# Patient Record
Sex: Male | Born: 2000 | Hispanic: Yes | Marital: Single | Smoking: Never smoker
Health system: Southern US, Community
[De-identification: ages and names within clinical notes are randomized; demographics above are authoritative.]

## PROBLEM LIST (undated history)

## (undated) DIAGNOSIS — R45851 Suicidal ideations: Secondary | ICD-10-CM

## (undated) DIAGNOSIS — F938 Other childhood emotional disorders: Secondary | ICD-10-CM

## (undated) DIAGNOSIS — F840 Autistic disorder: Secondary | ICD-10-CM

## (undated) DIAGNOSIS — F323 Major depressive disorder, single episode, severe with psychotic features: Secondary | ICD-10-CM

## (undated) HISTORY — DX: Suicidal ideations: R45.851

---
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
## 2020-01-01 DIAGNOSIS — F320 Major depressive disorder, single episode, mild: Secondary | ICD-10-CM | POA: Diagnosis not present

## 2020-01-08 DIAGNOSIS — F320 Major depressive disorder, single episode, mild: Secondary | ICD-10-CM | POA: Diagnosis not present

## 2020-01-15 DIAGNOSIS — F320 Major depressive disorder, single episode, mild: Secondary | ICD-10-CM | POA: Diagnosis not present

## 2020-01-22 DIAGNOSIS — F320 Major depressive disorder, single episode, mild: Secondary | ICD-10-CM | POA: Diagnosis not present

## 2020-01-29 DIAGNOSIS — F320 Major depressive disorder, single episode, mild: Secondary | ICD-10-CM | POA: Diagnosis not present

## 2020-02-05 DIAGNOSIS — F320 Major depressive disorder, single episode, mild: Secondary | ICD-10-CM | POA: Diagnosis not present

## 2020-02-12 DIAGNOSIS — F320 Major depressive disorder, single episode, mild: Secondary | ICD-10-CM | POA: Diagnosis not present

## 2020-02-19 DIAGNOSIS — F320 Major depressive disorder, single episode, mild: Secondary | ICD-10-CM | POA: Diagnosis not present

## 2020-02-26 DIAGNOSIS — F320 Major depressive disorder, single episode, mild: Secondary | ICD-10-CM | POA: Diagnosis not present

## 2020-03-04 DIAGNOSIS — F320 Major depressive disorder, single episode, mild: Secondary | ICD-10-CM | POA: Diagnosis not present

## 2020-03-11 DIAGNOSIS — F320 Major depressive disorder, single episode, mild: Secondary | ICD-10-CM | POA: Diagnosis not present

## 2020-03-18 DIAGNOSIS — F320 Major depressive disorder, single episode, mild: Secondary | ICD-10-CM | POA: Diagnosis not present

## 2020-03-25 DIAGNOSIS — F320 Major depressive disorder, single episode, mild: Secondary | ICD-10-CM | POA: Diagnosis not present

## 2020-04-01 DIAGNOSIS — F320 Major depressive disorder, single episode, mild: Secondary | ICD-10-CM | POA: Diagnosis not present

## 2020-04-08 DIAGNOSIS — F320 Major depressive disorder, single episode, mild: Secondary | ICD-10-CM | POA: Diagnosis not present

## 2020-04-15 DIAGNOSIS — F320 Major depressive disorder, single episode, mild: Secondary | ICD-10-CM | POA: Diagnosis not present

## 2020-04-22 DIAGNOSIS — F320 Major depressive disorder, single episode, mild: Secondary | ICD-10-CM | POA: Diagnosis not present

## 2020-04-29 DIAGNOSIS — F320 Major depressive disorder, single episode, mild: Secondary | ICD-10-CM | POA: Diagnosis not present

## 2020-05-06 DIAGNOSIS — F320 Major depressive disorder, single episode, mild: Secondary | ICD-10-CM | POA: Diagnosis not present

## 2020-05-13 DIAGNOSIS — F320 Major depressive disorder, single episode, mild: Secondary | ICD-10-CM | POA: Diagnosis not present

## 2020-05-20 DIAGNOSIS — F320 Major depressive disorder, single episode, mild: Secondary | ICD-10-CM | POA: Diagnosis not present

## 2020-05-27 DIAGNOSIS — F320 Major depressive disorder, single episode, mild: Secondary | ICD-10-CM | POA: Diagnosis not present

## 2020-06-03 DIAGNOSIS — F320 Major depressive disorder, single episode, mild: Secondary | ICD-10-CM | POA: Diagnosis not present

## 2020-06-10 DIAGNOSIS — F320 Major depressive disorder, single episode, mild: Secondary | ICD-10-CM | POA: Diagnosis not present

## 2020-06-17 DIAGNOSIS — F320 Major depressive disorder, single episode, mild: Secondary | ICD-10-CM | POA: Diagnosis not present

## 2020-06-24 DIAGNOSIS — F320 Major depressive disorder, single episode, mild: Secondary | ICD-10-CM | POA: Diagnosis not present

## 2020-07-01 DIAGNOSIS — F320 Major depressive disorder, single episode, mild: Secondary | ICD-10-CM | POA: Diagnosis not present

## 2020-07-08 DIAGNOSIS — F320 Major depressive disorder, single episode, mild: Secondary | ICD-10-CM | POA: Diagnosis not present

## 2020-07-15 DIAGNOSIS — F320 Major depressive disorder, single episode, mild: Secondary | ICD-10-CM | POA: Diagnosis not present

## 2020-07-22 DIAGNOSIS — F320 Major depressive disorder, single episode, mild: Secondary | ICD-10-CM | POA: Diagnosis not present

## 2020-07-29 DIAGNOSIS — F320 Major depressive disorder, single episode, mild: Secondary | ICD-10-CM | POA: Diagnosis not present

## 2020-08-05 DIAGNOSIS — F320 Major depressive disorder, single episode, mild: Secondary | ICD-10-CM | POA: Diagnosis not present

## 2020-08-12 DIAGNOSIS — F320 Major depressive disorder, single episode, mild: Secondary | ICD-10-CM | POA: Diagnosis not present

## 2020-08-19 DIAGNOSIS — F320 Major depressive disorder, single episode, mild: Secondary | ICD-10-CM | POA: Diagnosis not present

## 2020-08-26 DIAGNOSIS — F320 Major depressive disorder, single episode, mild: Secondary | ICD-10-CM | POA: Diagnosis not present

## 2020-09-02 DIAGNOSIS — F320 Major depressive disorder, single episode, mild: Secondary | ICD-10-CM | POA: Diagnosis not present

## 2020-09-09 ENCOUNTER — Encounter (HOSPITAL_COMMUNITY): Payer: Self-pay

## 2020-09-09 ENCOUNTER — Ambulatory Visit (HOSPITAL_COMMUNITY)
Admission: EM | Admit: 2020-09-09 | Discharge: 2020-09-09 | Disposition: A | Discharge status: Home / Self Care | Payer: Medicaid Other | Attending: Emergency Medicine | Admitting: Emergency Medicine

## 2020-09-09 ENCOUNTER — Other Ambulatory Visit: Payer: Self-pay

## 2020-09-09 DIAGNOSIS — H66002 Acute suppurative otitis media without spontaneous rupture of ear drum, left ear: Secondary | ICD-10-CM

## 2020-09-09 DIAGNOSIS — F320 Major depressive disorder, single episode, mild: Secondary | ICD-10-CM | POA: Diagnosis not present

## 2020-09-09 MED ORDER — IBUPROFEN 600 MG PO TABS: 600.0000 mg | ORAL_TABLET | Freq: Four times a day (QID) | ORAL | 0 refills | Status: AC | PRN

## 2020-09-09 MED ORDER — AMOXICILLIN-POT CLAVULANATE 875-125 MG PO TABS: 1.0000 | ORAL_TABLET | Freq: Two times a day (BID) | ORAL | 0 refills | Status: AC

## 2020-09-09 NOTE — Discharge Instructions (Addendum)
Finish the antibiotics, even if you feel better.  Take 600 mg of ibuprofen combined with 1000 mg of Tylenol together 3-4 times a day as needed for pain.

## 2020-09-09 NOTE — ED Provider Notes (Signed)
HPI  SUBJECTIVE:  Keith Mayer is a 20 y.o. male who presents with left ear pain, popping and clicking of his left jaw starting last night.  He states that he can "feel the air pushing through it".  No fevers, nasal congestion, rhinorrhea, allergy symptoms, foreign body insertion, recent swimming, change in hearing, otorrhea, vertigo.  He does not grind his teeth that he knows of.  He has not tried anything for this.  He states that when he yawns and pops his ears, symptoms improve.  Symptoms are worse with yawning.  They are not associated with chewing.  Past medical history negative for frequent otitis media, diabetes.  He has a history of autism.  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 No pertinent surgical history.  History reviewed. No pertinent family history.  Social History   Tobacco Use   Smoking status: Never   Smokeless tobacco: Never  Substance Use Topics   Alcohol use: No    No current facility-administered medications for this encounter.  Current Outpatient Medications:    amoxicillin-clavulanate (AUGMENTIN) 875-125 MG tablet, Take 1 tablet by mouth 2 (two) times daily for 7 days., Disp: 14 tablet, Rfl: 0   ibuprofen (ADVIL) 600 MG tablet, Take 1 tablet (600 mg total) by mouth every 6 (six) hours as needed., Disp: 30 tablet, Rfl: 0  Allergies  Allergen Reactions   Miralax [Polyethylene Glycol] Rash     ROS  As noted in HPI.   Physical Exam  BP 130/76 (BP Location: Right Arm)   Pulse 75   Temp 99.1 F (37.3 C) (Oral)   Resp 20   SpO2 100%   Constitutional: Well developed, well nourished, no acute distress Eyes:  EOMI, conjunctiva normal  bilaterally HENT: Normocephalic, atraumatic, left external ear normal.  No pain with traction of pinna, palpation of tragus, mastoid.  EAC normal.  Left TM dull, erythematous, bulging but intact.  Decreased hearing compared to right ear.  Right external ear, EAC, TM normal.  No nasal congestion. Neck: No cervical lymphadenopathy Respiratory: Normal inspiratory effort Cardiovascular: Normal rate GI: nondistended skin: No rash, skin intact Musculoskeletal: no deformities Neurologic: Alert & oriented x 3, no focal neuro deficits Psychiatric: Speech and behavior appropriate   ED Course   Medications - No data to display  No orders of the defined types were placed in this encounter.   No results found for this or any previous visit (from the past 24 hour(s)). No results found.  ED Clinical Impression  1. Non-recurrent acute suppurative otitis media of left ear without spontaneous rupture of tympanic membrane      ED Assessment/Plan  Patient with a left-sided otitis media.  Home with Augmentin, Tylenol/ibuprofen.  Follow-up or may return here in 2 to 3 days if no improvement.   Discussed MDM, treatment plan, and plan for follow-up with patient.  patient agrees with plan.   Meds ordered this encounter  Medications   amoxicillin-clavulanate (AUGMENTIN) 875-125 MG tablet    Sig: Take 1 tablet by mouth 2 (two) times daily for 7 days.    Dispense:  14 tablet    Refill:  0   ibuprofen (ADVIL) 600 MG tablet  Sig: Take 1 tablet (600 mg total) by mouth every 6 (six) hours as needed.    Dispense:  30 tablet    Refill:  0      *This clinic note was created using Scientist, clinical (histocompatibility and immunogenetics). Therefore, there may be occasional mistakes despite careful proofreading.  ?    Domenick Gong, MD 09/10/20 [phone]

## 2020-09-09 NOTE — ED Triage Notes (Signed)
Pt presents with left ear pain X 3 days. States when air goes into it, the pain worsens.

## 2020-09-16 DIAGNOSIS — F320 Major depressive disorder, single episode, mild: Secondary | ICD-10-CM | POA: Diagnosis not present

## 2020-09-23 DIAGNOSIS — F320 Major depressive disorder, single episode, mild: Secondary | ICD-10-CM | POA: Diagnosis not present

## 2020-09-30 DIAGNOSIS — F320 Major depressive disorder, single episode, mild: Secondary | ICD-10-CM | POA: Diagnosis not present

## 2020-10-07 DIAGNOSIS — F320 Major depressive disorder, single episode, mild: Secondary | ICD-10-CM | POA: Diagnosis not present

## 2020-10-14 DIAGNOSIS — F320 Major depressive disorder, single episode, mild: Secondary | ICD-10-CM | POA: Diagnosis not present

## 2020-10-21 DIAGNOSIS — F320 Major depressive disorder, single episode, mild: Secondary | ICD-10-CM | POA: Diagnosis not present

## 2020-10-28 DIAGNOSIS — F320 Major depressive disorder, single episode, mild: Secondary | ICD-10-CM | POA: Diagnosis not present

## 2020-11-04 DIAGNOSIS — F320 Major depressive disorder, single episode, mild: Secondary | ICD-10-CM | POA: Diagnosis not present

## 2020-11-11 DIAGNOSIS — F320 Major depressive disorder, single episode, mild: Secondary | ICD-10-CM | POA: Diagnosis not present

## 2020-11-18 DIAGNOSIS — F320 Major depressive disorder, single episode, mild: Secondary | ICD-10-CM | POA: Diagnosis not present

## 2020-11-25 DIAGNOSIS — F320 Major depressive disorder, single episode, mild: Secondary | ICD-10-CM | POA: Diagnosis not present

## 2020-12-02 DIAGNOSIS — F320 Major depressive disorder, single episode, mild: Secondary | ICD-10-CM | POA: Diagnosis not present

## 2020-12-03 ENCOUNTER — Encounter (HOSPITAL_COMMUNITY): Payer: Self-pay

## 2020-12-03 ENCOUNTER — Other Ambulatory Visit: Payer: Self-pay

## 2020-12-03 ENCOUNTER — Ambulatory Visit (HOSPITAL_COMMUNITY)
Admission: EM | Admit: 2020-12-03 | Discharge: 2020-12-03 | Disposition: A | Discharge status: Home / Self Care | Payer: Medicaid Other

## 2020-12-03 DIAGNOSIS — R0989 Other specified symptoms and signs involving the circulatory and respiratory systems: Secondary | ICD-10-CM | POA: Diagnosis not present

## 2020-12-03 DIAGNOSIS — R509 Fever, unspecified: Secondary | ICD-10-CM | POA: Diagnosis not present

## 2020-12-03 MED ORDER — ACETAMINOPHEN 325 MG PO TABS
ORAL_TABLET | ORAL | Status: AC
  Filled 2020-12-03: qty 2

## 2020-12-03 MED ORDER — ACETAMINOPHEN 325 MG PO TABS
650.0000 mg | ORAL_TABLET | Freq: Once | ORAL | Status: AC
  Administered 2020-12-03: 650 mg via ORAL

## 2020-12-03 MED ORDER — PROMETHAZINE-DM 6.25-15 MG/5ML PO SYRP: 5.0000 mL | ORAL_SOLUTION | Freq: Four times a day (QID) | ORAL | 0 refills | Status: AC | PRN

## 2020-12-03 NOTE — ED Triage Notes (Signed)
Pt reports cough, runny nose, fever, body aches x 6 days. States he had a nosebleed yesterday.

## 2020-12-03 NOTE — Discharge Instructions (Addendum)
-  Promethazine DM cough syrup for congestion/cough. This could make you drowsy, so take at night before bed. -Pick up some Mucinex for during the day -For fevers/chills, bodyaches, headaches- You can take Tylenol up to 1000 mg 3 times daily, and ibuprofen up to 600 mg 3 times daily with food.  You can take these together, or alternate every 3-4 hours. -Drink plenty of water/gatorade and get plenty of rest -With a virus, you're typically contagious for 5-7 days, or as long as you're having fevers.  -Come back and see [place] if things are getting worse instead of better, like shortness of breath, chest pain, fevers and chills that are getting higher instead of lower and do not come down with Tylenol or ibuprofen, etc.

## 2020-12-03 NOTE — ED Provider Notes (Signed)
[facility]    CSN: [id number] Arrival date & time: 12/03/20  1142      History   Chief Complaint Chief Complaint  Patient presents with   Cough    HPI Whitman L Lipson is a 20 y.o. male presenting with dry cough, bodyaches, chills, fatigue, decreased appetite.  Medical history noncontributory.  Tolerating fluids and food.  Does not monitor temperature at home.  Took Tylenol 1 day ago.  Denies known sick contacts.  Needs note for work.  Here today with mom.  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   Anxiety disorder of adolescence    Autism spectrum disorder 02/06/2015   MDD (major depressive disorder), single episode, severe with psychotic features ([place]) 02/06/2015   Suicidal ideation 02/05/2015    Patient Active Problem List   Diagnosis Date Noted   Healthcare maintenance 10/30/2019   Moderate mixed hyperlipidemia not requiring statin therapy 10/30/2019   Tinea cruris 03/19/2017   History of suicidal ideation 03/19/2017   Autism spectrum disorder 02/06/2015   Depression, major, single episode, complete remission ([place]) 02/06/2015   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 No pertinent surgical history.     Home Medications    Prior to Admission medications   Medication Sig Start Date End Date Taking? Authorizing Provider  acetaminophen (TYLENOL) 500 MG tablet Take 500 mg by mouth every 6 (six) hours as needed.   Yes [provider]  promethazine-dextromethorphan (PROMETHAZINE-DM) 6.25-15 MG/5ML syrup Take 5 mLs by mouth 4 (four) times daily as needed for cough. 12/03/20  Yes Hazel Sams, PA-C  ibuprofen (ADVIL) 600 MG tablet Take 1 tablet (600 mg total) by mouth every 6 (six) hours as needed. 09/09/20   Melynda Ripple, MD    Family History History reviewed. No pertinent family history.  Social History Social History   Tobacco Use   Smoking status: Never    Smokeless tobacco: Never  Substance Use Topics   Alcohol use: No     Allergies   Miralax [polyethylene glycol]   Review of Systems Review of Systems  Constitutional:  Positive for chills. Negative for appetite change and fever.  HENT:  Positive for congestion. Negative for ear pain, rhinorrhea, sinus pressure, sinus pain and sore throat.   Eyes:  Negative for redness and visual disturbance.  Respiratory:  Positive for cough. Negative for chest tightness, shortness of breath and wheezing.   Cardiovascular:  Negative for chest pain and palpitations.  Gastrointestinal:  Negative for abdominal pain, constipation, diarrhea, nausea and vomiting.  Genitourinary:  Negative for dysuria, frequency and urgency.  Musculoskeletal:  Positive for myalgias.  Neurological:  Negative for dizziness, weakness and headaches.  Psychiatric/Behavioral:  Negative for confusion.   All other systems reviewed and are negative.   Physical Exam Triage Vital Signs ED Triage Vitals  Enc Vitals Group     BP 12/03/20 1304 111/72     Pulse Rate 12/03/20 1304 96     Resp 12/03/20 1304 18     Temp 12/03/20 1304 (!) 100.6 F (38.1 C)     Temp Source 12/03/20 1304 Oral     SpO2 12/03/20 1304 96 %     Weight --      Height --      Head Circumference --      Peak Flow --      Pain Score 12/03/20 1303 0     Pain Loc --  Pain Edu? --      Excl. in GC? --    No data found.  Updated Vital Signs BP 111/72 (BP Location: Right Arm)   Pulse 96   Temp (!) 100.6 F (38.1 C) (Oral)   Resp 18   SpO2 96%   Visual Acuity Right Eye Distance:   Left Eye Distance:   Bilateral Distance:    Right Eye Near:   Left Eye Near:    Bilateral Near:     Physical Exam Vitals reviewed.  Constitutional:      General: He is not in acute distress.    Appearance: Normal appearance. He is ill-appearing.  HENT:     Head: Normocephalic and atraumatic.     Right Ear: Tympanic membrane, ear canal and external ear  normal. No tenderness. No middle ear effusion. There is no impacted cerumen. Tympanic membrane is not perforated, erythematous, retracted or bulging.     Left Ear: Tympanic membrane, ear canal and external ear normal. No tenderness.  No middle ear effusion. There is no impacted cerumen. Tympanic membrane is not perforated, erythematous, retracted or bulging.     Nose: Nose normal. No congestion.     Mouth/Throat:     Mouth: Mucous membranes are moist.     Pharynx: Uvula midline. Posterior oropharyngeal erythema present. No oropharyngeal exudate.     Comments: Smooth erythema posterior pharynx Eyes:     Extraocular Movements: Extraocular movements intact.     Pupils: Pupils are equal, round, and reactive to light.  Cardiovascular:     Rate and Rhythm: Normal rate and regular rhythm.     Heart sounds: Normal heart sounds.  Pulmonary:     Effort: Pulmonary effort is normal.     Breath sounds: Normal breath sounds. No decreased breath sounds, wheezing, rhonchi or rales.  Abdominal:     Palpations: Abdomen is soft.     Tenderness: There is no abdominal tenderness. There is no guarding or rebound.  Lymphadenopathy:     Cervical: No cervical adenopathy.     Right cervical: No superficial cervical adenopathy.    Left cervical: No superficial cervical adenopathy.  Neurological:     General: No focal deficit present.     Mental Status: He is alert and oriented to person, place, and time.  Psychiatric:        Mood and Affect: Mood normal.        Behavior: Behavior normal.        Thought Content: Thought content normal.        Judgment: Judgment normal.     UC Treatments / Results  Labs (all labs ordered are listed, but only abnormal results are displayed) Labs Reviewed - No data to display  EKG   Radiology No results found.  Procedures Procedures (including critical care time)  Medications Ordered in UC Medications  acetaminophen (TYLENOL) tablet 650 mg (650 mg Oral Given  12/03/20 1312)    Initial Impression / Assessment and Plan / UC Course  I have reviewed the triage vital signs and the nursing notes.  Pertinent labs & imaging results that were available during my care of the patient were reviewed by me and considered in my medical decision making (see chart for details).     This patient is a very pleasant 20 y.o. year old male presenting with suspected influenza x6 days. Today this pt is febrile but nontachycardic nontachypneic, oxygenating well on room air, no wheezes rhonchi or rales.   Promethazine DM, OTC  medications for relief. Work note provided.   ED return precautions discussed. Patient verbalizes understanding and agreement.     Final Clinical Impressions(s) / UC Diagnoses   Final diagnoses:  Suspected novel influenza A virus infection  Febrile illness     Discharge Instructions      -Promethazine DM cough syrup for congestion/cough. This could make you drowsy, so take at night before bed. -Pick up some Mucinex for during the day -For fevers/chills, bodyaches, headaches- You can take Tylenol up to 1000 mg 3 times daily, and ibuprofen up to 600 mg 3 times daily with food.  You can take these together, or alternate every 3-4 hours. -Drink plenty of water/gatorade and get plenty of rest -With a virus, you're typically contagious for 5-7 days, or as long as you're having fevers.  -Come back and see [place] if things are getting worse instead of better, like shortness of breath, chest pain, fevers and chills that are getting higher instead of lower and do not come down with Tylenol or ibuprofen, etc.    ED Prescriptions     Medication Sig Dispense Auth. Provider   promethazine-dextromethorphan (PROMETHAZINE-DM) 6.25-15 MG/5ML syrup Take 5 mLs by mouth 4 (four) times daily as needed for cough. 118 mL Hazel Sams, PA-C      PDMP not reviewed this encounter.   Hazel Sams, PA-C 12/03/20 1421

## 2020-12-09 DIAGNOSIS — F320 Major depressive disorder, single episode, mild: Secondary | ICD-10-CM | POA: Diagnosis not present

## 2020-12-16 DIAGNOSIS — F320 Major depressive disorder, single episode, mild: Secondary | ICD-10-CM | POA: Diagnosis not present

## 2021-02-25 ENCOUNTER — Ambulatory Visit (INDEPENDENT_AMBULATORY_CARE_PROVIDER_SITE_OTHER): Payer: Medicaid Other | Admitting: Family Medicine

## 2021-02-25 ENCOUNTER — Encounter: Payer: Self-pay | Admitting: Family Medicine

## 2021-02-25 ENCOUNTER — Other Ambulatory Visit: Payer: Self-pay

## 2021-02-25 DIAGNOSIS — Z0000 Encounter for general adult medical examination without abnormal findings: Secondary | ICD-10-CM | POA: Diagnosis not present

## 2021-02-25 DIAGNOSIS — B353 Tinea pedis: Secondary | ICD-10-CM | POA: Insufficient documentation

## 2021-02-25 DIAGNOSIS — B356 Tinea cruris: Secondary | ICD-10-CM

## 2021-02-25 MED ORDER — KETOCONAZOLE 2 % EX CREA: 1.0000 "application " | TOPICAL_CREAM | Freq: Every day | CUTANEOUS | 0 refills | Status: AC

## 2021-02-25 NOTE — Progress Notes (Signed)
° ° ° °  SUBJECTIVE:   CHIEF COMPLAINT / HPI:   Keith Mayer is a 21 y.o. male presents for rash   Foot rash Pt reports rash over dorsum of left foot and between toes for several months. It looks like the skin is cracking and dry. It is itchy sometimes. He sometimes picks at the wounds. Denies injuries to the area. Does not moisturize his skin. Denies recent travel. Not sexually active.   Flowsheet Row Office Visit from 02/25/2021 in [facility]  PHQ-9 Total Score 4       PERTINENT  PMH / PSH: autism, depression   OBJECTIVE:   BP 110/61    Pulse (!) 56    Wt 153 lb (69.4 kg)    SpO2 99%    BMI 26.68 kg/m    General: Alert, no acute distress Cardio: well perfused  Pulm: normal work of breathing Neuro: Cranial nerves grossly intact  \       ASSESSMENT/PLAN:   Tinea cruris Most likely tinea cruris. Recommended good foot hygiene ie changing socks daily, breathable footwear, cleaning feet daily etc. Sent ketoconazole 2% cream to the pharmacy. Follow up if no improvement in the next 2-3 weeks.   Healthcare maintenance Declined flu, covid and HPV vaccine today. Pt will book RN visit next week for vaccines.     Towanda Octave, MD PGY-3 [facility]

## 2021-02-25 NOTE — Assessment & Plan Note (Signed)
Declined flu, covid and HPV vaccine today. Pt will book RN visit next week for vaccines.

## 2021-02-25 NOTE — Assessment & Plan Note (Signed)
Most likely tinea cruris. Recommended good foot hygiene ie changing socks daily, breathable footwear, cleaning feet daily etc. Sent ketoconazole 2% cream to the pharmacy. Follow up if no improvement in the next 2-3 weeks.

## 2021-02-25 NOTE — Patient Instructions (Signed)
Thank you for coming to see me today. It was a pleasure. Today we discussed your foot wounds.  It is probably a fungal infection. I recommend ketoconazole cream to the affected area twice a day. Ensure good foot hygiene-cleaning feet daily, changing socks regularly etc  Please follow-up with me if no improvement in 2-3 weeks.   Book nurse visit for vaccines-covid, flu and HPV  If you have any questions or concerns, please do not hesitate to call the office at [phone].  Best wishes,   Dr Allena Katz

## 2021-03-04 ENCOUNTER — Ambulatory Visit (INDEPENDENT_AMBULATORY_CARE_PROVIDER_SITE_OTHER): Payer: Medicaid Other

## 2021-03-04 ENCOUNTER — Other Ambulatory Visit: Payer: Self-pay

## 2021-03-04 DIAGNOSIS — Z23 Encounter for immunization: Secondary | ICD-10-CM | POA: Diagnosis present

## 2021-07-27 ENCOUNTER — Encounter: Payer: Medicaid Other | Admitting: Family Medicine

## 2021-07-27 NOTE — Progress Notes (Deleted)
    SUBJECTIVE:   Chief compliant/HPI: annual examination  Keith Mayer is a 21 y.o. who presents today for an annual exam.   Reviewed and updated history ***.   Review of systems form notable for ***.    OBJECTIVE:   There were no vitals taken for this visit.  ***  ASSESSMENT/PLAN:   No problem-specific Assessment & Plan notes found for this encounter.    Annual Examination  See AVS for age appropriate recommendations  PHQ score ***, reviewed and discussed.  Blood pressure reviewed and at goal. ***   Advanced directive ***   Considered the following items based upon USPSTF recommendations: HIV testing: {not indicated/requested/declined:14582} Hepatitis C: {not indicated/requested/declined:14582} Hepatitis B: {not indicated/requested/declined:14582} Syphilis if at high risk: {{not indicated/requested/declined:14582} GC/CT{not indicated/requested/declined:14582} Lipid panel (nonfasting or fasting) discussed based upon AHA recommendations and {ordered not order:23822}.  Consider repeat every 4-6 years.  Reviewed risk factors for latent tuberculosis and {not indicated/requested/declined:14582} Immunizations ***   Follow up in 1 year or sooner if indicated.    Towanda Octave, MD [facility]

## 2021-07-31 NOTE — Progress Notes (Signed)
    SUBJECTIVE:   Chief compliant/HPI: annual examination  Keith Mayer is a 21 y.o. who presents today for an annual exam.   Patient is not yet sexually active. He has lost weight, BMI today is 25. Last year during lipid labs he was not fasting.  Patient works at [employer] and [employer] of [employer]. He has had acute thoracic back pain around T4-T5.  It is bilateral, mild pain.  He has no problems gripping, no numbness.  He has tried some stretching, but thinks that the amount that he lifts at work is probably contributing.  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  Mood: Reports that he sometimes still has low mood, not currently happening.  He has no SI.  PHQ-9 is 6, not difficult at all.  He is not interested in therapy or medication at this time.  Reviewed and updated history .   Review of systems form notable for thoracic back pain.  OBJECTIVE:   BP 108/69   Pulse (!) 49   Ht 5\' 3"  (1.6 m)   Wt 149 lb (67.6 kg)   BMI 26.39 kg/m   Nursing note and vitals reviewed GEN: Young adult Latino man, resting comfortably in chair, NAD, WNWD HEENT: NCAT. PERRLA. Sclera without injection or icterus. MMM.  Clear oropharynx Neck: Supple.  No LAD Cardiac: Bradycardic rate and regular rhythm. Normal S1/S2. No murmurs, rubs, or gallops appreciated. 2+ radial pulses. Lungs: Clear bilaterally to ascultation. No increased WOB, no accessory muscle usage. No w/r/r. Thoracic spine: - Inspection: no gross deformity or asymmetry, swelling or ecchymosis. No skin changes - Palpation: No TTP over the spinous processes or paraspinal muscles - ROM: full active ROM of the lumbar spine in flexion and extension without pain - Strength: 5/5 strength in bilateral upper and lower extremities - Neuro: sensation intact in bilateral upper and lower extremities, 2+ biceps and patellar reflexes -Normal scapular function Ext: no edema Psych: Pleasant and appropriate  ASSESSMENT/PLAN:    Moderate mixed hyperlipidemia not requiring statin therapy Patient BMI normal.  Lipid recheck not required, he was not fasting last year.  Recommend rechecking if BMI reaches obese level again.  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  Recommend decreasing amount lifting at 1 time.  Also given exercises to warm up.  Recommend otherwise relative rest, anti-inflammatory including naproxen, topical therapies, see please see AVS.  No red flag symptoms, normal neuro exam.  Recommend if pain continues that patient may have to take relative rest from work, consider doing physical therapy.    Annual Examination  See AVS for age appropriate recommendations  PHQ score 6, reviewed and discussed.  Blood pressure reviewed and at goal.     Considered the following items based upon USPSTF recommendations: HIV testing: not indicated Hepatitis C: not indicated Hepatitis B: not indicated Syphilis if at high risk: {not indicated GC/CTnot indicated Lipid panel (nonfasting or fasting) discussed based upon AHA recommendations and not ordered.  Consider repeat every 4-6 years.  Reviewed risk factors for latent tuberculosis and not indicated-father is originally from [place], mother is originally from [place].  No family history of TB.  No one in family has symptoms of night sweats, hemoptysis, weight loss. Immunizations up-to-date  Follow up in 1 year or sooner if indicated.    [place], MD [facility]

## 2021-08-01 ENCOUNTER — Ambulatory Visit: Payer: Medicaid Other

## 2021-08-01 ENCOUNTER — Ambulatory Visit (INDEPENDENT_AMBULATORY_CARE_PROVIDER_SITE_OTHER): Payer: Medicaid Other | Admitting: Family Medicine

## 2021-08-01 ENCOUNTER — Encounter: Payer: Self-pay | Admitting: Family Medicine

## 2021-08-01 VITALS — BP 108/69 | HR 49 | Ht 63.0 in | Wt 149.0 lb

## 2021-08-01 DIAGNOSIS — M546 Pain in thoracic spine: Secondary | ICD-10-CM

## 2021-08-01 DIAGNOSIS — Z113 Encounter for screening for infections with a predominantly sexual mode of transmission: Secondary | ICD-10-CM | POA: Diagnosis not present

## 2021-08-01 DIAGNOSIS — E782 Mixed hyperlipidemia: Secondary | ICD-10-CM

## 2021-08-01 MED ORDER — NAPROXEN 500 MG PO TABS: 500.0000 mg | ORAL_TABLET | Freq: Two times a day (BID) | ORAL | 0 refills | Status: AC

## 2021-08-01 NOTE — Assessment & Plan Note (Signed)
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  Recommend decreasing amount lifting at 1 time.  Also given exercises to warm up.  Recommend otherwise relative rest, anti-inflammatory including naproxen, topical therapies, see please see AVS.  No red flag symptoms, normal neuro exam.  Recommend if pain continues that patient may have to take relative rest from work, consider doing physical therapy.

## 2021-08-01 NOTE — Patient Instructions (Addendum)
Back pain is caused by many things: perhaps lifting too much, being out of shape, injury to the muscles or nerves, or muscle spasm. The best things you can do are continue to move, use different types of therapy (see below), and then strengthen muscles to get better. There is no "quick fix," but by working diligently and using multi-modal approaches you can decrease your pain and be more functional. - pain treatment: you can try voltaren gel, salonpas, capsaicin, and/or lidocaine patches (not at the same time) for back relief - Also try ice, heating pads, massage, and stretching exercises - keep walking to keep your muscles from getting cold and tight - You can use tylenol or ibuprofen or naproxen for muscle spasms - If you are not better using the above or feeling worse, please come back for evaluation and you may need referral to physical therapy  Come back in 1 year for your next physical

## 2021-08-01 NOTE — Assessment & Plan Note (Signed)
Patient BMI normal.  Lipid recheck not required, he was not fasting last year.  Recommend rechecking if BMI reaches obese level again.

## 2022-03-10 ENCOUNTER — Encounter: Payer: Self-pay | Admitting: Student

## 2022-03-10 ENCOUNTER — Ambulatory Visit (INDEPENDENT_AMBULATORY_CARE_PROVIDER_SITE_OTHER): Payer: Medicaid Other | Admitting: Student

## 2022-03-10 VITALS — BP 110/86 | HR 59 | Resp 16 | Ht 65.0 in | Wt 150.0 lb

## 2022-03-10 DIAGNOSIS — R55 Syncope and collapse: Secondary | ICD-10-CM | POA: Diagnosis present

## 2022-03-10 LAB — GLUCOSE, POCT (MANUAL RESULT ENTRY): POC Glucose: 105 mg/dl — AB (ref 70–99)
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
## 2022-03-11 LAB — BASIC METABOLIC PANEL
BUN/Creatinine Ratio: 19 (ref 9–20)
BUN: 14 mg/dL (ref 6–20)
CO2: 20 mmol/L (ref 20–29)
Calcium: 9.6 mg/dL (ref 8.7–10.2)
Chloride: 102 mmol/L (ref 96–106)
Creatinine, Ser: 0.72 mg/dL — ABNORMAL LOW (ref 0.76–1.27)
Glucose: 85 mg/dL (ref 70–99)
Potassium: 4.3 mmol/L (ref 3.5–5.2)
Sodium: 141 mmol/L (ref 134–144)
eGFR: 133 mL/min/{1.73_m2} (ref >59)
